# Patient Record
Sex: Male | Born: 1995 | Race: White | Hispanic: No | Marital: Single | Smoking: Never smoker
Health system: Southern US, Community
[De-identification: ages and names within clinical notes are randomized; demographics above are authoritative.]

## PROBLEM LIST (undated history)

## (undated) HISTORY — PX: BLADDER SURGERY: SHX569

---
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
## 2016-06-16 ENCOUNTER — Encounter: Payer: Self-pay | Admitting: Emergency Medicine

## 2016-06-16 ENCOUNTER — Emergency Department
Admission: EM | Admit: 2016-06-16 | Discharge: 2016-06-16 | Disposition: A | Discharge status: Home / Self Care | Payer: Commercial Managed Care - PPO | Attending: Emergency Medicine | Admitting: Emergency Medicine

## 2016-06-16 DIAGNOSIS — B850 Pediculosis due to Pediculus humanus capitis: Secondary | ICD-10-CM | POA: Insufficient documentation

## 2016-06-16 DIAGNOSIS — B852 Pediculosis, unspecified: Secondary | ICD-10-CM

## 2016-06-16 MED ORDER — PERMETHRIN 1 % EX LOTN: 1.0000 "application " | TOPICAL_LOTION | Freq: Once | CUTANEOUS | 0 refills | Status: AC

## 2016-06-16 NOTE — ED Provider Notes (Signed)
[facility] Emergency Department Provider Note  ____________________________________________  Time seen: Approximately 9:04 PM  I have reviewed the triage vital signs and the nursing notes.   HISTORY  Chief Complaint Head Lice    HPI ORDELL Cortez is a 21 y.o. male who presents emergency department for complaint of possible lice. He reports that his daughter was sent home from school with a note that another child in class and lice. He and his wife have come through the child's hair and have seen lice and nits. Patient reports that his gout is "itchy". No complete this time. No medications prior to arrival.   History reviewed. No pertinent past medical history.  There are no active problems to display for this patient.   History reviewed. No pertinent surgical history.  Prior to Admission medications   Medication Sig Start Date End Date Taking? Authorizing Provider  permethrin (PERMETHRIN LICE TREATMENT) 1 % lotion Apply 1 application topically once. Shampoo, rinse and towel dry hair, saturate hair and scalp with permethrin. Use over other affected surfaces. Rinse after 10 min; repeat in 1 week if needed 06/16/16 06/16/16  Curtis Ha D Cuthriell, PA-C    Allergies Penicillins  History reviewed. No pertinent family history.  Social History Social History  Substance Use Topics  . Smoking status: Never Smoker  . Smokeless tobacco: Never Used  . Alcohol use No     Review of Systems  Constitutional: No fever/chills Eyes: No visual changes. No discharge ENT: No upper respiratory complaints. Cardiovascular: no chest pain. Respiratory: no cough. No SOB. Gastrointestinal: No abdominal pain.  No nausea, no vomiting. Musculoskeletal: Negative for musculoskeletal pain. Skin: Possible lice in hair Neurological: Negative for headaches, focal weakness or numbness. 10-point ROS otherwise  negative.  ____________________________________________   PHYSICAL EXAM:  VITAL SIGNS: ED Triage Vitals [06/16/16 2038]  Enc Vitals Group     BP 121/71     Pulse Rate 79     Resp 16     Temp 97.9 F (36.6 C)     Temp Source Oral     SpO2 100 %     Weight 130 lb (59 kg)     Height 5\' 9"  (1.753 m)     Head Circumference      Peak Flow      Pain Score      Pain Loc      Pain Edu?      Excl. in GC?      Constitutional: Alert and oriented. Well appearing and in no acute distress. Eyes: Conjunctivae are normal. PERRL. EOMI. Head: Atraumatic.Examination of the scalp reveals multiple nits. No visible lice. ENT:      Ears:       Nose: No congestion/rhinnorhea.      Mouth/Throat: Mucous membranes are moist.  Neck: No stridor.    Cardiovascular: Normal rate, regular rhythm. Normal S1 and S2.  Good peripheral circulation. Respiratory: Normal respiratory effort without tachypnea or retractions. Lungs CTAB. Good air entry to the bases with no decreased or absent breath sounds. Musculoskeletal: Full range of motion to all extremities. No gross deformities appreciated. Neurologic:  Normal speech and language. No gross focal neurologic deficits are appreciated.  Skin:  Skin is warm, dry and intact. No rash noted. Psychiatric: Mood and affect are normal. Speech and behavior are normal. Patient exhibits appropriate insight and judgement.   ____________________________________________   LABS (all labs ordered are listed, but only abnormal results are displayed)  Labs Reviewed - No data  to display ____________________________________________  EKG   ____________________________________________  RADIOLOGY   No results found.  ____________________________________________    PROCEDURES  Procedure(s) performed:    Procedures    Medications - No data to display   ____________________________________________   INITIAL IMPRESSION / ASSESSMENT AND PLAN / ED  COURSE  Pertinent labs & imaging results that were available during my care of the patient were reviewed by me and considered in my medical decision making (see chart for details).  Review of the [place] CSRS was performed in accordance of the [facility] prior to dispensing any controlled drugs.     Patient's diagnosis is consistent with lice infested hair.. Patient will be discharged home with prescriptions for permethrin. Patient is to follow up with primary care as needed or otherwise directed. Patient is given ED precautions to return to the ED for any worsening or new symptoms.     ____________________________________________  FINAL CLINICAL IMPRESSION(S) / ED DIAGNOSES  Final diagnoses:  Lice infested hair      NEW MEDICATIONS STARTED DURING THIS VISIT:  Discharge Medication List as of 06/16/2016  9:43 PM    START taking these medications   Details  permethrin (PERMETHRIN LICE TREATMENT) 1 % lotion Apply 1 application topically once. Shampoo, rinse and towel dry hair, saturate hair and scalp with permethrin. Use over other affected surfaces. Rinse after 10 min; repeat in 1 week if needed, Starting Thu 06/16/2016, Print            This chart was dictated using voice recognition software/Dragon. Despite best efforts to proofread, errors can occur which can change the meaning. Any change was purely unintentional.    Curtis PatchesJonathan D Cuthriell, PA-C 06/16/16 2325    Curtis Everyobert Kinner, MD 06/20/16 [phone]

## 2016-06-16 NOTE — ED Triage Notes (Signed)
Pt ambulatory to triage in NAD, report child sent note from school stating other student found to have lice and family need to be checked

## 2017-09-23 ENCOUNTER — Other Ambulatory Visit: Payer: Self-pay

## 2017-09-23 ENCOUNTER — Emergency Department
Admission: EM | Admit: 2017-09-23 | Discharge: 2017-09-23 | Disposition: A | Discharge status: Home / Self Care | Payer: Commercial Managed Care - PPO | Attending: Student in an Organized Health Care Education/Training Program | Admitting: Student in an Organized Health Care Education/Training Program

## 2017-09-23 ENCOUNTER — Encounter: Payer: Self-pay | Admitting: Emergency Medicine

## 2017-09-23 DIAGNOSIS — L739 Follicular disorder, unspecified: Secondary | ICD-10-CM

## 2017-09-23 DIAGNOSIS — R21 Rash and other nonspecific skin eruption: Secondary | ICD-10-CM | POA: Diagnosis present

## 2017-09-23 DIAGNOSIS — L02221 Furuncle of abdominal wall: Secondary | ICD-10-CM | POA: Diagnosis not present

## 2017-09-23 MED ORDER — MUPIROCIN CALCIUM 2 % EX CREA: 1.0000 "application " | TOPICAL_CREAM | Freq: Two times a day (BID) | CUTANEOUS | 0 refills | Status: DC

## 2017-09-23 NOTE — ED Provider Notes (Signed)
[facility] Emergency Department Provider Note  ____________________________________________   First MD Initiated Contact with Patient 09/23/17 1409     (approximate)  I have reviewed the triage vital signs and the nursing notes.   HISTORY  Chief Complaint Rash    HPI Curtis Cortez is a 22 y.o. male presents emergency department complaining of a bump that showed up on one side of his pelvic area.  The other side has several bumps.  He states he showed up on Thursday and are very itchy.  He states he has been sweating in this area also.  He denies any fever or chills.  Denies any new sexual contacts.  only had sex with his girlfriend.  Denies any penile discharge.  He denies any painful rash.  History reviewed. No pertinent past medical history.  There are no active problems to display for this patient.   History reviewed. No pertinent surgical history.  Prior to Admission medications   Medication Sig Start Date End Date Taking? Authorizing Provider  mupirocin cream (BACTROBAN) 2 % Apply 1 application topically 2 (two) times daily. 09/23/17   Faythe GheeFisher, Laurence Folz W, PA-C    Allergies Penicillins  No family history on file.  Social History Social History   Tobacco Use  . Smoking status: Never Smoker  . Smokeless tobacco: Never Used  Substance Use Topics  . Alcohol use: No  . Drug use: Not on file    Review of Systems  Constitutional: No fever/chills Eyes: No visual changes. ENT: No sore throat. Respiratory: Denies cough Genitourinary: Negative for dysuria. Musculoskeletal: Negative for back pain. Skin: Positive for rash.    ____________________________________________   PHYSICAL EXAM:  VITAL SIGNS: ED Triage Vitals  Enc Vitals Group     BP 09/23/17 1324 129/82     Pulse Rate 09/23/17 1324 86     Resp 09/23/17 1324 18     Temp 09/23/17 1324 98.7 F (37.1 C)     Temp Source 09/23/17 1324 Oral     SpO2 09/23/17 1324 99 %    Weight 09/23/17 1325 130 lb (59 kg)     Height 09/23/17 1325 5\' 9"  (1.753 m)     Head Circumference --      Peak Flow --      Pain Score 09/23/17 1325 0     Pain Loc --      Pain Edu? --      Excl. in GC? --     Constitutional: Alert and oriented. Well appearing and in no acute distress. Eyes: Conjunctivae are normal.  Head: Atraumatic. Nose: No congestion/rhinnorhea. Mouth/Throat: Mucous membranes are moist.   Cardiovascular: Normal rate, regular rhythm. Respiratory: Normal respiratory effort.  No retractions GU: deferred Musculoskeletal: FROM all extremities, warm and well perfused Neurologic:  Normal speech and language.  Skin:  Skin is warm, dry and intact.  Positive for several small pustules noted in the pubic area at the base of the penis.  No vesicles are noted.  No chancre is noted.  No discharge of the penis is noted Psychiatric: Mood and affect are normal. Speech and behavior are normal.  ____________________________________________   LABS (all labs ordered are listed, but only abnormal results are displayed)  Labs Reviewed - No data to display ____________________________________________   ____________________________________________  RADIOLOGY    ____________________________________________   PROCEDURES  Procedure(s) performed: No  Procedures    ____________________________________________   INITIAL IMPRESSION / ASSESSMENT AND PLAN / ED COURSE  Pertinent labs &  imaging results that were available during my care of the patient were reviewed by me and considered in my medical decision making (see chart for details).  Patient is 22 year old male presents emergency department complaining of a rash around his penis and in his genital area.  He describes this as itchy.  On physical exam there are several pustules noted in the hair of the pubic area.  There are no insects/crabs noted.  There are no vesicles or chancres noted in this area  either.  Explained the findings to the patient.  He was given a prescription for Bactroban and is to buy over-the-counter hydrocortisone cream.  He is to apply the medication twice a day.  If he is worsening he should return to emergency department if there is no change she is to see his regular doctor.  He states he understands comply with our instructions.  Was discharged in stable condition     As part of my medical decision making, I reviewed the following data within the electronic MEDICAL RECORD NUMBER Nursing notes reviewed and incorporated, Notes from prior ED visits and [place] Controlled Substance Database  ____________________________________________   FINAL CLINICAL IMPRESSION(S) / ED DIAGNOSES  Final diagnoses:  Folliculitis      NEW MEDICATIONS STARTED DURING THIS VISIT:  Discharge Medication List as of 09/23/2017  2:20 PM    START taking these medications   Details  mupirocin cream (BACTROBAN) 2 % Apply 1 application topically 2 (two) times daily., Starting Sat 09/23/2017, Print         Note:  This document was prepared using Dragon voice recognition software and may include unintentional dictation errors.    Faythe Ghee, PA-C 09/23/17 1831    Willy Eddy, MD 09/24/17 [phone]

## 2017-09-23 NOTE — ED Notes (Signed)
Pt reports noticing one "bump" that showed up on one side of pelvic area and several "bumps" on the other side. Showed up on Thursday. Have not gotten worse but have not gone away. Pt reports thinking "they should have gone away by now."   Denies redness, swelling, pain. Occasionally itchy.

## 2017-09-23 NOTE — Discharge Instructions (Addendum)
Follow-up with your regular doctor or the emergency department if you are not better in 3 to 5 days.  Use medication as prescribed.  Use powder in this area to prevent sweating.

## 2017-09-23 NOTE — ED Triage Notes (Signed)
Rash genital area x 2 days.

## 2019-12-23 ENCOUNTER — Other Ambulatory Visit: Payer: Self-pay

## 2019-12-23 ENCOUNTER — Emergency Department
Admission: EM | Admit: 2019-12-23 | Discharge: 2019-12-23 | Disposition: A | Discharge status: Home / Self Care | Payer: Commercial Managed Care - PPO | Attending: Emergency Medicine | Admitting: Emergency Medicine

## 2019-12-23 DIAGNOSIS — L0231 Cutaneous abscess of buttock: Secondary | ICD-10-CM | POA: Diagnosis not present

## 2019-12-23 DIAGNOSIS — K6289 Other specified diseases of anus and rectum: Secondary | ICD-10-CM | POA: Diagnosis present

## 2019-12-23 DIAGNOSIS — L03317 Cellulitis of buttock: Secondary | ICD-10-CM | POA: Insufficient documentation

## 2019-12-23 MED ORDER — SULFAMETHOXAZOLE-TRIMETHOPRIM 800-160 MG PO TABS: 1.0000 | ORAL_TABLET | Freq: Two times a day (BID) | ORAL | 0 refills | Status: DC

## 2019-12-23 NOTE — ED Triage Notes (Signed)
First nurse note- pt here to have his hemorrhoid evaluated.

## 2019-12-23 NOTE — Discharge Instructions (Addendum)
Follow discharge care instructions and take medication as directed. 

## 2019-12-23 NOTE — ED Triage Notes (Signed)
Pt states "I think I have hemorrhoids" pt c/o rectal pain for the past week, denies any bleeding.

## 2019-12-23 NOTE — ED Provider Notes (Signed)
[facility] Emergency Department Provider Note   ____________________________________________   First MD Initiated Contact with Patient 12/23/19 1301     (approximate)  I have reviewed the triage vital signs and the nursing notes.   HISTORY  Chief Complaint Rectal Pain    HPI Curtis Cortez is a 24 y.o. male patient presents with rectal pain.  Patient states "I think I have hemorrhoids".  Patient states pain for 1 week.  Denies any bleeding.  Denies history of constipation.         History reviewed. No pertinent past medical history.  There are no problems to display for this patient.   Past Surgical History:  Procedure Laterality Date   BLADDER SURGERY      Prior to Admission medications   Medication Sig Start Date End Date Taking? Authorizing Provider  mupirocin cream (BACTROBAN) 2 % Apply 1 application topically 2 (two) times daily. 09/23/17   Fisher, Roselyn Bering, PA-C  sulfamethoxazole-trimethoprim (BACTRIM DS) 800-160 MG tablet Take 1 tablet by mouth 2 (two) times daily. 12/23/19   Joni Reining, PA-C    Allergies Penicillins  No family history on file.  Social History Social History   Tobacco Use   Smoking status: Never Smoker   Smokeless tobacco: Never Used  Substance Use Topics   Alcohol use: No   Drug use: Not on file    Review of Systems Constitutional: No fever/chills Eyes: No visual changes. ENT: No sore throat. Cardiovascular: Denies chest pain. Respiratory: Denies shortness of breath. Gastrointestinal: No abdominal pain.  No nausea, no vomiting.  No diarrhea.  No constipation. Genitourinary: Negative for dysuria. Musculoskeletal: Negative for back pain. Skin: Negative for rash. Neurological: Negative for headaches, focal weakness or numbness.  Allergic/Immunilogical: Penicillin. ____________________________________________   PHYSICAL EXAM:  VITAL SIGNS: ED Triage Vitals  Enc Vitals Group     BP  12/23/19 1246 120/78     Pulse Rate 12/23/19 1246 67     Resp 12/23/19 1246 18     Temp 12/23/19 1246 98 F (36.7 C)     Temp Source 12/23/19 1246 Oral     SpO2 12/23/19 1246 100 %     Weight 12/23/19 1247 135 lb (61.2 kg)     Height 12/23/19 1247 5\' 9"  (1.753 m)     Head Circumference --      Peak Flow --      Pain Score 12/23/19 1247 7     Pain Loc --      Pain Edu? --      Excl. in GC? --     Constitutional: Alert and oriented. Well appearing and in no acute distress. Cardiovascular: Normal rate, regular rhythm. Grossly normal heart sounds.  Good peripheral circulation. Respiratory: Normal respiratory effort.  No retractions. Lungs CTAB.  Musculoskeletal: No lower extremity tenderness nor edema.  No joint effusions. Neurologic:  Normal speech and language. No gross focal neurologic deficits are appreciated. No gait instability. Skin:  Skin is warm, dry and intact.  Nonfluctuant nodule lesion right buttocks.    ____________________________________________   LABS (all labs ordered are listed, but only abnormal results are displayed)  Labs Reviewed - No data to display ____________________________________________  EKG   ____________________________________________  RADIOLOGY  ED MD interpretation:    Official radiology report(s): No results found.  ____________________________________________   PROCEDURES  Procedure(s) performed (including Critical Care):  Procedures   ____________________________________________   INITIAL IMPRESSION / ASSESSMENT AND PLAN / ED COURSE  As part  of my medical decision making, I reviewed the following data within the electronic MEDICAL RECORD NUMBER     Patient presents for rectal pain with suspected hemorrhoid.  Physical exam reveals area of concern of the right buttocks with a nonfluctuant nodule lesion.  Discussed with patient rationale for not incising and draining at this time.  Conservative treatment consists of sitz bath and  antibiotics.  Patient vies return back if condition worsens.          ____________________________________________   FINAL CLINICAL IMPRESSION(S) / ED DIAGNOSES  Final diagnoses:  Cellulitis and abscess of buttock     ED Discharge Orders         Ordered    sulfamethoxazole-trimethoprim (BACTRIM DS) 800-160 MG tablet  2 times daily        12/23/19 1313          *Please note:  Curtis Cortez was evaluated in Emergency Department on 12/23/2019 for the symptoms described in the history of present illness. He was evaluated in the context of the global COVID-19 pandemic, which necessitated consideration that the patient might be at risk for infection with the SARS-CoV-2 virus that causes COVID-19. Institutional protocols and algorithms that pertain to the evaluation of patients at risk for COVID-19 are in a state of rapid change based on information released by regulatory bodies including the CDC and federal and state organizations. These policies and algorithms were followed during the patient's care in the ED.  Some ED evaluations and interventions may be delayed as a result of limited staffing during and the pandemic.*   Note:  This document was prepared using Dragon voice recognition software and may include unintentional dictation errors.    Joni Reining, PA-C 12/23/19 1319    Jene Every, MD 12/23/19 1400

## 2019-12-24 ENCOUNTER — Encounter: Payer: Self-pay | Admitting: Emergency Medicine

## 2019-12-24 ENCOUNTER — Emergency Department
Admission: EM | Admit: 2019-12-24 | Discharge: 2019-12-24 | Disposition: A | Discharge status: Home / Self Care | Payer: Commercial Managed Care - PPO | Attending: Emergency Medicine | Admitting: Emergency Medicine

## 2019-12-24 DIAGNOSIS — K611 Rectal abscess: Secondary | ICD-10-CM | POA: Diagnosis not present

## 2019-12-24 DIAGNOSIS — L0231 Cutaneous abscess of buttock: Secondary | ICD-10-CM

## 2019-12-24 MED ORDER — OXYCODONE-ACETAMINOPHEN 7.5-325 MG PO TABS: 1.0000 | ORAL_TABLET | Freq: Four times a day (QID) | ORAL | 0 refills | Status: DC | PRN

## 2019-12-24 MED ORDER — LIDOCAINE HCL (PF) 1 % IJ SOLN
INTRAMUSCULAR | Status: AC
  Administered 2019-12-24: 5 mL
  Filled 2019-12-24: qty 5

## 2019-12-24 MED ORDER — LIDOCAINE HCL (PF) 1 % IJ SOLN: 5.0000 mL | Freq: Once | INTRAMUSCULAR | Status: AC

## 2019-12-24 NOTE — ED Provider Notes (Signed)
[facility] Emergency Department Provider Note   ____________________________________________   First MD Initiated Contact with Patient 12/24/19 1255     (approximate)  I have reviewed the triage vital signs and the nursing notes.   HISTORY  Chief Complaint Abscess    HPI Curtis Cortez is a 24 y.o. male patient presents with abscess to right buttocks.  Patient was seen yesterday at this facility.  Patient was advised at that time that the lesion was nonfluctuant and was given discharge care instructions consisting of sitz bath and antibiotics.  Patient stated lesion has increased in size status post 2 sitz bath's.  States pain is 8/10.  Described pain as sore.  No drainage.         History reviewed. No pertinent past medical history.  There are no problems to display for this patient.   Past Surgical History:  Procedure Laterality Date  . BLADDER SURGERY      Prior to Admission medications   Medication Sig Start Date End Date Taking? Authorizing Provider  oxyCODONE-acetaminophen (PERCOCET) 7.5-325 MG tablet Take 1 tablet by mouth every 6 (six) hours as needed for severe pain. 12/24/19   Joni Reining, PA-C  sulfamethoxazole-trimethoprim (BACTRIM DS) 800-160 MG tablet Take 1 tablet by mouth 2 (two) times daily. 12/23/19   Joni Reining, PA-C    Allergies Penicillins  No family history on file.  Social History Social History   Tobacco Use  . Smoking status: Never Smoker  . Smokeless tobacco: Never Used  Vaping Use  . Vaping Use: Never used  Substance Use Topics  . Alcohol use: No  . Drug use: Never    Review of Systems Constitutional: No fever/chills Eyes: No visual changes. ENT: No sore throat. Cardiovascular: Denies chest pain. Respiratory: Denies shortness of breath. Gastrointestinal: No abdominal pain.  No nausea, no vomiting.  No diarrhea.  No constipation. Genitourinary: Negative for dysuria. Musculoskeletal:  Negative for back pain. Skin: Negative for rash.  Nodule lesion right buttocks. Neurological: Negative for headaches, focal weakness or numbness. Allergic/Immunilogical: Penicillin  ____________________________________________   PHYSICAL EXAM:  VITAL SIGNS: ED Triage Vitals  Enc Vitals Group     BP 12/24/19 1233 116/71     Pulse Rate 12/24/19 1233 99     Resp 12/24/19 1233 16     Temp 12/24/19 1233 98.3 F (36.8 C)     Temp Source 12/24/19 1233 Oral     SpO2 12/24/19 1233 97 %     Weight 12/24/19 1234 132 lb 4.4 oz (60 kg)     Height 12/24/19 1234 5\' 8"  (1.727 m)     Head Circumference --      Peak Flow --      Pain Score 12/24/19 1233 8     Pain Loc --      Pain Edu? --      Excl. in GC? --     Constitutional: Alert and oriented. Well appearing and in no acute distress. Cardiovascular: Normal rate, regular rhythm. Grossly normal heart sounds.  Good peripheral circulation. Respiratory: Normal respiratory effort.  No retractions. Lungs CTAB. Skin: Fluctuant erythematous nodule lesion right buttocks.   Psychiatric: Mood and affect are normal. Speech and behavior are normal.  ____________________________________________   LABS (all labs ordered are listed, but only abnormal results are displayed)  Labs Reviewed - No data to display ____________________________________________  EKG   ____________________________________________  RADIOLOGY  ED MD interpretation:    Official radiology report(s): No results  found.  ____________________________________________   PROCEDURES  Procedure(s) performed (including Critical Care):  Marland KitchenMarland KitchenIncision and Drainage  Date/Time: 12/24/2019 1:23 PM Performed by: Joni Reining, PA-C Authorized by: Joni Reining, PA-C   Consent:    Consent obtained:  Verbal   Consent given by:  Patient   Risks discussed:  Bleeding, incomplete drainage, pain and infection Location:    Type:  Abscess   Location:  Anogenital   Anogenital  location:  Perianal Pre-procedure details:    Skin preparation:  Betadine Anesthesia (see MAR for exact dosages):    Anesthesia method:  Local infiltration   Local anesthetic:  Lidocaine 1% w/o epi Procedure type:    Complexity:  Simple Procedure details:    Incision types:  Single with marsupialization   Incision depth:  Dermal   Scalpel blade:  11   Wound management:  Probed and deloculated and irrigated with saline   Drainage:  Purulent   Drainage amount:  Moderate   Wound treatment:  Wound left open   Packing materials:  None Post-procedure details:    Patient tolerance of procedure:  Tolerated well, no immediate complications     ____________________________________________   INITIAL IMPRESSION / ASSESSMENT AND PLAN / ED COURSE  As part of my medical decision making, I reviewed the following data within the electronic MEDICAL RECORD NUMBER     Patient present erythematous and fluctuant nodule lesion right buttocks.  See procedure note for incision and drainage.  Patient given discharge care instructions advised to continue antibiotics.  Patient given a work note for 3 days.  Advised return to ED if condition worsens.          ____________________________________________   FINAL CLINICAL IMPRESSION(S) / ED DIAGNOSES  Final diagnoses:  Abscess of buttock, right     ED Discharge Orders         Ordered    oxyCODONE-acetaminophen (PERCOCET) 7.5-325 MG tablet  Every 6 hours PRN        12/24/19 1319          *Please note:  Curtis Cortez was evaluated in Emergency Department on 12/24/2019 for the symptoms described in the history of present illness. He was evaluated in the context of the global COVID-19 pandemic, which necessitated consideration that the patient might be at risk for infection with the SARS-CoV-2 virus that causes COVID-19. Institutional protocols and algorithms that pertain to the evaluation of patients at risk for COVID-19 are in a state of  rapid change based on information released by regulatory bodies including the CDC and federal and state organizations. These policies and algorithms were followed during the patient's care in the ED.  Some ED evaluations and interventions may be delayed as a result of limited staffing during and the pandemic.*   Note:  This document was prepared using Dragon voice recognition software and may include unintentional dictation errors.    Joni Reining, PA-C 12/24/19 1325    Sharman Cheek, MD 12/25/19 1213

## 2019-12-24 NOTE — ED Notes (Signed)
See triage note - area red, swollen. Per pt 0/10 pain when laying still, intense pain with ambulation.

## 2019-12-24 NOTE — Discharge Instructions (Signed)
Follow discharge care instructions and continue antibiotics.

## 2019-12-24 NOTE — ED Triage Notes (Signed)
Pt in via POV, reports perirectal abscess x approximately one week, was seen yesterday but states, "I feel like I need it drained."  Ambulatory to triage, vitals WDL, NAD noted.

## 2020-04-15 ENCOUNTER — Encounter: Payer: Self-pay | Admitting: Emergency Medicine

## 2020-04-15 ENCOUNTER — Emergency Department
Admission: EM | Admit: 2020-04-15 | Discharge: 2020-04-15 | Disposition: A | Discharge status: Home / Self Care | Payer: Commercial Managed Care - PPO | Attending: Emergency Medicine | Admitting: Emergency Medicine

## 2020-04-15 ENCOUNTER — Other Ambulatory Visit: Payer: Self-pay

## 2020-04-15 DIAGNOSIS — L0231 Cutaneous abscess of buttock: Secondary | ICD-10-CM | POA: Diagnosis present

## 2020-04-15 DIAGNOSIS — K611 Rectal abscess: Secondary | ICD-10-CM | POA: Diagnosis not present

## 2020-04-15 MED ORDER — LIDOCAINE-EPINEPHRINE-TETRACAINE (LET) TOPICAL GEL
3.0000 mL | Freq: Once | TOPICAL | Status: AC
  Administered 2020-04-15: 3 mL via TOPICAL
  Filled 2020-04-15: qty 3

## 2020-04-15 MED ORDER — LIDOCAINE HCL (PF) 1 % IJ SOLN
5.0000 mL | Freq: Once | INTRAMUSCULAR | Status: DC
  Filled 2020-04-15: qty 5

## 2020-04-15 MED ORDER — OXYCODONE-ACETAMINOPHEN 7.5-325 MG PO TABS: 1.0000 | ORAL_TABLET | Freq: Four times a day (QID) | ORAL | 0 refills | Status: AC | PRN

## 2020-04-15 MED ORDER — SULFAMETHOXAZOLE-TRIMETHOPRIM 800-160 MG PO TABS: 1.0000 | ORAL_TABLET | Freq: Two times a day (BID) | ORAL | 0 refills | Status: AC

## 2020-04-15 NOTE — Discharge Instructions (Signed)
Call to make an appointment with the surgeon listed on your discharge papers.  Today the surgeon is Dr. Claudine Mouton if you continue to have problems are not improving.  You may continue with sits baths.  Begin taking the antibiotics until completely finished.  Also prescription for pain medication was sent to your pharmacy.  You may need to take stool softener such as Colace with the pain medication to prevent constipation.  If the drain that was placed today has not fallen out in the next 2 days return to the emergency department or go to an urgent care to have this removed.

## 2020-04-15 NOTE — ED Provider Notes (Signed)
[facility] Emergency Department Provider Note  ____________________________________________   Event Date/Time   First MD Initiated Contact with Patient 04/15/20 1111     (approximate)  I have reviewed the triage vital signs and the nursing notes.   HISTORY  Chief Complaint Abscess   HPI Curtis Cortez is a 25 y.o. male presents to the ED with complaint of abscess to his left buttocks for the last 5 days.  Patient stated he had 1 prior and it went away with antibiotics and I&D.  He denies any fever, chills, nausea or vomiting.  He has been sitting in a tub of warm water.  He rates his pain as 7 out of 10.     History reviewed. No pertinent past medical history.  There are no problems to display for this patient.   Past Surgical History:  Procedure Laterality Date  . BLADDER SURGERY      Prior to Admission medications   Medication Sig Start Date End Date Taking? Authorizing Provider  oxyCODONE-acetaminophen (PERCOCET) 7.5-325 MG tablet Take 1 tablet by mouth every 6 (six) hours as needed for severe pain. 04/15/20   Tommi Rumps, PA-C  sulfamethoxazole-trimethoprim (BACTRIM DS) 800-160 MG tablet Take 1 tablet by mouth 2 (two) times daily. 04/15/20   Tommi Rumps, PA-C    Allergies Penicillins  No family history on file.  Social History Social History   Tobacco Use  . Smoking status: Never Smoker  . Smokeless tobacco: Never Used  Vaping Use  . Vaping Use: Never used  Substance Use Topics  . Alcohol use: No  . Drug use: Never    Review of Systems Constitutional: No fever/chills Eyes: No visual changes. ENT: No sore throat. Cardiovascular: Denies chest pain. Respiratory: Denies shortness of breath. Gastrointestinal: No abdominal pain.  No nausea, no vomiting.  No diarrhea.  Genitourinary: Negative for dysuria. Musculoskeletal: Negative for back pain. Skin: Positive for abscess. Neurological: Negative for headaches,  focal weakness or numbness.  ____________________________________________   PHYSICAL EXAM:  VITAL SIGNS: ED Triage Vitals  Enc Vitals Group     BP 04/15/20 1000 131/73     Pulse Rate 04/15/20 1000 80     Resp 04/15/20 1000 16     Temp 04/15/20 1000 98.3 F (36.8 C)     Temp Source 04/15/20 1000 Oral     SpO2 04/15/20 1000 99 %     Weight 04/15/20 0957 132 lb 4.4 oz (60 kg)     Height 04/15/20 0957 5\' 8"  (1.727 m)     Head Circumference --      Peak Flow --      Pain Score 04/15/20 0957 7     Pain Loc --      Pain Edu? --      Excl. in GC? --     Constitutional: Alert and oriented. Well appearing and in no acute distress. Eyes: Conjunctivae are normal.  Head: Atraumatic. Neck: No stridor.   Cardiovascular: Normal rate, regular rhythm. Grossly normal heart sounds.  Good peripheral circulation. Respiratory: Normal respiratory effort.  No retractions. Lungs CTAB. Musculoskeletal: Moves upper and lower extremities without any difficulty. Neurologic:  Normal speech and language. No gross focal neurologic deficits are appreciated. No gait instability. Skin:  Skin is warm, dry and intact.  On examination of the left buttocks medially there is a nonerythematous tender area just lateral to the anus.  No active drainage noted.  Area is fluctuant.  Skin is intact. Psychiatric:  Mood and affect are normal. Speech and behavior are normal.  ____________________________________________   LABS (all labs ordered are listed, but only abnormal results are displayed)  Labs Reviewed - No data to display  PROCEDURES  Procedure(s) performed (including Critical Care):  Marland KitchenMarland KitchenIncision and Drainage  Date/Time: 04/15/2020 12:25 PM Performed by: Tommi Rumps, PA-C Authorized by: Tommi Rumps, PA-C   Consent:    Consent obtained:  Verbal   Consent given by:  Patient   Risks, benefits, and alternatives were discussed: yes     Risks discussed:  Incomplete drainage, infection and  pain Universal protocol:    Patient identity confirmed:  Verbally with patient Location:    Type:  Abscess   Location:  Anogenital   Anogenital location:  Perirectal Sedation:    Sedation type:  None Anesthesia:    Anesthesia method:  Topical application and local infiltration   Topical anesthetic:  LET   Local anesthetic:  Lidocaine 1% w/o epi Procedure type:    Complexity:  Simple Procedure details:    Needle aspiration: no     Incision types:  Stab incision   Wound management:  Probed and deloculated   Drainage:  Purulent   Drainage amount:  Moderate   Wound treatment:  Drain placed   Packing materials:  1/4 in iodoform gauze   Amount 1/4" iodoform:  2 " Post-procedure details:    Procedure completion:  Tolerated   ____________________________________________   INITIAL IMPRESSION / ASSESSMENT AND PLAN / ED COURSE  As part of my medical decision making, I reviewed the following data within the electronic MEDICAL RECORD NUMBER Notes from prior ED visits and [place] Controlled Substance Database  25 year old male presents to the ED with abscess to his left buttocks similar to when he was seen previously and had it I&D.  Patient states that this is started approximate 5 days ago and he has been soaking in a tub of warm water without any improvement.  He denies any fever, chills, nausea or vomiting.  I&D was discussed with patient and he is aware of this procedure as he has had it done in the past.  He tolerated the procedure well and moderate amount of purulent material was removed.  Patient was given a prescription for Bactrim DS twice daily for 10 days and Percocet 7.5 mg to take every 8 hours if needed.  He is to return in 2 days for packing removal if it has not fallen out on its own.  Patient also was given the name of a surgeon to follow-up with if he continues to have problems and ordered for further assessment and possible  surgery.  ____________________________________________   FINAL CLINICAL IMPRESSION(S) / ED DIAGNOSES  Final diagnoses:  Perirectal abscess     ED Discharge Orders         Ordered    sulfamethoxazole-trimethoprim (BACTRIM DS) 800-160 MG tablet  2 times daily        04/15/20 1252    oxyCODONE-acetaminophen (PERCOCET) 7.5-325 MG tablet  Every 6 hours PRN        04/15/20 1252          *Please note:  MARKEL KURTENBACH was evaluated in Emergency Department on 04/15/2020 for the symptoms described in the history of present illness. He was evaluated in the context of the global COVID-19 pandemic, which necessitated consideration that the patient might be at risk for infection with the SARS-CoV-2 virus that causes COVID-19. Institutional protocols and algorithms that pertain to the  evaluation of patients at risk for COVID-19 are in a state of rapid change based on information released by regulatory bodies including the CDC and federal and state organizations. These policies and algorithms were followed during the patient's care in the ED.  Some ED evaluations and interventions may be delayed as a result of limited staffing during and the pandemic.*   Note:  This document was prepared using Dragon voice recognition software and may include unintentional dictation errors.    Tommi Rumps, PA-C 04/15/20 1619    Gilles Chiquito, MD 04/15/20 [phone]

## 2020-04-15 NOTE — ED Triage Notes (Signed)
Abscess to buttocks since Friday.

## 2020-04-17 ENCOUNTER — Other Ambulatory Visit: Payer: Self-pay

## 2020-04-17 ENCOUNTER — Encounter: Payer: Self-pay | Admitting: Emergency Medicine

## 2020-04-17 DIAGNOSIS — Z5321 Procedure and treatment not carried out due to patient leaving prior to being seen by health care provider: Secondary | ICD-10-CM | POA: Diagnosis not present

## 2020-04-17 DIAGNOSIS — Z4800 Encounter for change or removal of nonsurgical wound dressing: Secondary | ICD-10-CM | POA: Insufficient documentation

## 2020-04-17 NOTE — ED Triage Notes (Signed)
PT to ED via POV, pt is here for wound recheck. Pt is in NAD.

## 2020-04-18 ENCOUNTER — Emergency Department
Admission: EM | Admit: 2020-04-18 | Discharge: 2020-04-18 | Disposition: A | Discharge status: Home / Self Care | Payer: Commercial Managed Care - PPO | Attending: Emergency Medicine | Admitting: Emergency Medicine

## 2020-04-18 NOTE — ED Notes (Signed)
No answer when called
